# Patient Record
Sex: Female | Born: 1955 | Race: White | Hispanic: No | Marital: Married | Smoking: Never smoker
Health system: Southern US, Community
[De-identification: ages and names within clinical notes are randomized; demographics above are authoritative.]

## PROBLEM LIST (undated history)

## (undated) DIAGNOSIS — N951 Menopausal and female climacteric states: Secondary | ICD-10-CM

## (undated) DIAGNOSIS — D179 Benign lipomatous neoplasm, unspecified: Secondary | ICD-10-CM

## (undated) DIAGNOSIS — F419 Anxiety disorder, unspecified: Secondary | ICD-10-CM

## (undated) HISTORY — DX: Anxiety disorder, unspecified: F41.9

## (undated) HISTORY — PX: BUNIONECTOMY: SHX129

## (undated) HISTORY — DX: Benign lipomatous neoplasm, unspecified: D17.9

## (undated) HISTORY — DX: Menopausal and female climacteric states: N95.1

---

## 2000-06-25 ENCOUNTER — Other Ambulatory Visit: Admission: RE | Admit: 2000-06-25 | Discharge: 2000-06-25 | Payer: Self-pay | Admitting: Gynecology

## 2001-07-06 ENCOUNTER — Other Ambulatory Visit: Admission: RE | Admit: 2001-07-06 | Discharge: 2001-07-06 | Payer: Self-pay | Admitting: Gynecology

## 2002-07-17 ENCOUNTER — Other Ambulatory Visit: Admission: RE | Admit: 2002-07-17 | Discharge: 2002-07-17 | Payer: Self-pay | Admitting: Gynecology

## 2003-07-23 ENCOUNTER — Other Ambulatory Visit: Admission: RE | Admit: 2003-07-23 | Discharge: 2003-07-23 | Payer: Self-pay | Admitting: Gynecology

## 2004-08-19 ENCOUNTER — Other Ambulatory Visit: Admission: RE | Admit: 2004-08-19 | Discharge: 2004-08-19 | Payer: Self-pay | Admitting: Gynecology

## 2005-06-09 ENCOUNTER — Encounter: Admission: RE | Admit: 2005-06-09 | Discharge: 2005-06-09 | Payer: Self-pay | Admitting: Internal Medicine

## 2005-06-09 ENCOUNTER — Ambulatory Visit: Payer: Self-pay | Admitting: Internal Medicine

## 2005-06-25 ENCOUNTER — Ambulatory Visit: Payer: Self-pay | Admitting: Internal Medicine

## 2005-08-24 ENCOUNTER — Other Ambulatory Visit: Admission: RE | Admit: 2005-08-24 | Discharge: 2005-08-24 | Payer: Self-pay | Admitting: Gynecology

## 2006-09-02 ENCOUNTER — Other Ambulatory Visit: Admission: RE | Admit: 2006-09-02 | Discharge: 2006-09-02 | Payer: Self-pay | Admitting: Gynecology

## 2006-09-14 ENCOUNTER — Encounter: Payer: Self-pay | Admitting: Internal Medicine

## 2006-09-21 ENCOUNTER — Ambulatory Visit: Payer: Self-pay | Admitting: Internal Medicine

## 2007-03-20 IMAGING — CR DG FOOT COMPLETE 3+V*L*
3 series · 3 of 3 positions shown · non-contrast
Comparison: none

CLINICAL DATA: Pain left foot.
 LEFT FOOT ? 3 VIEW:

[t foot ap left]
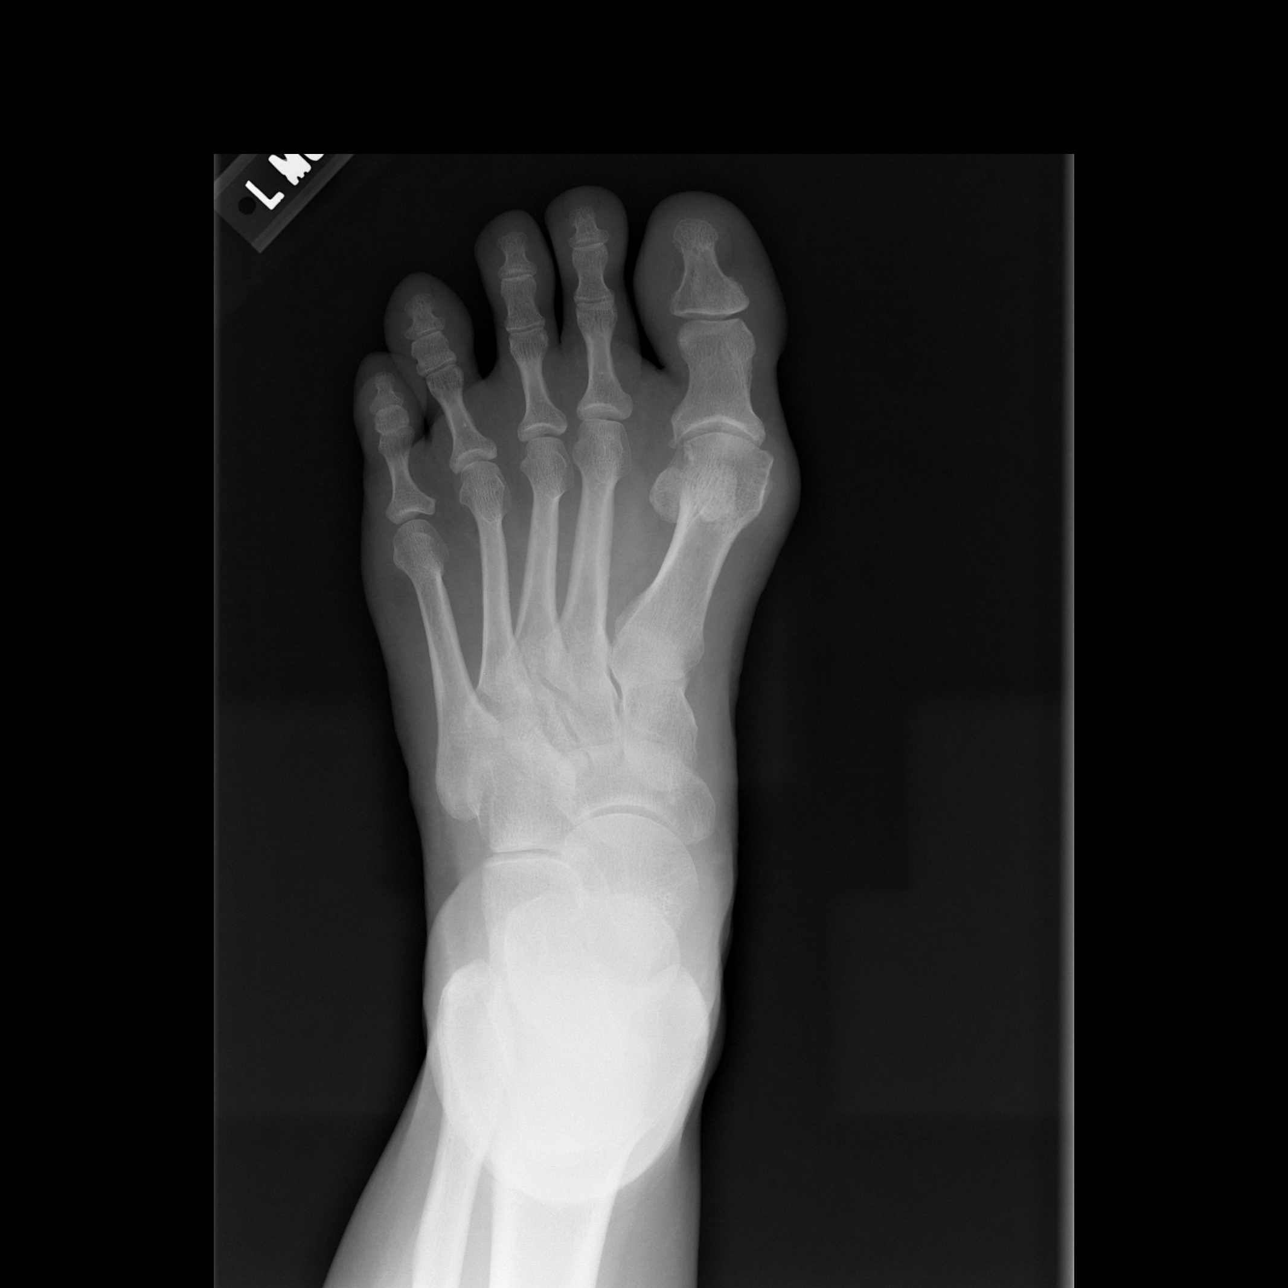

[t foot oblique left]
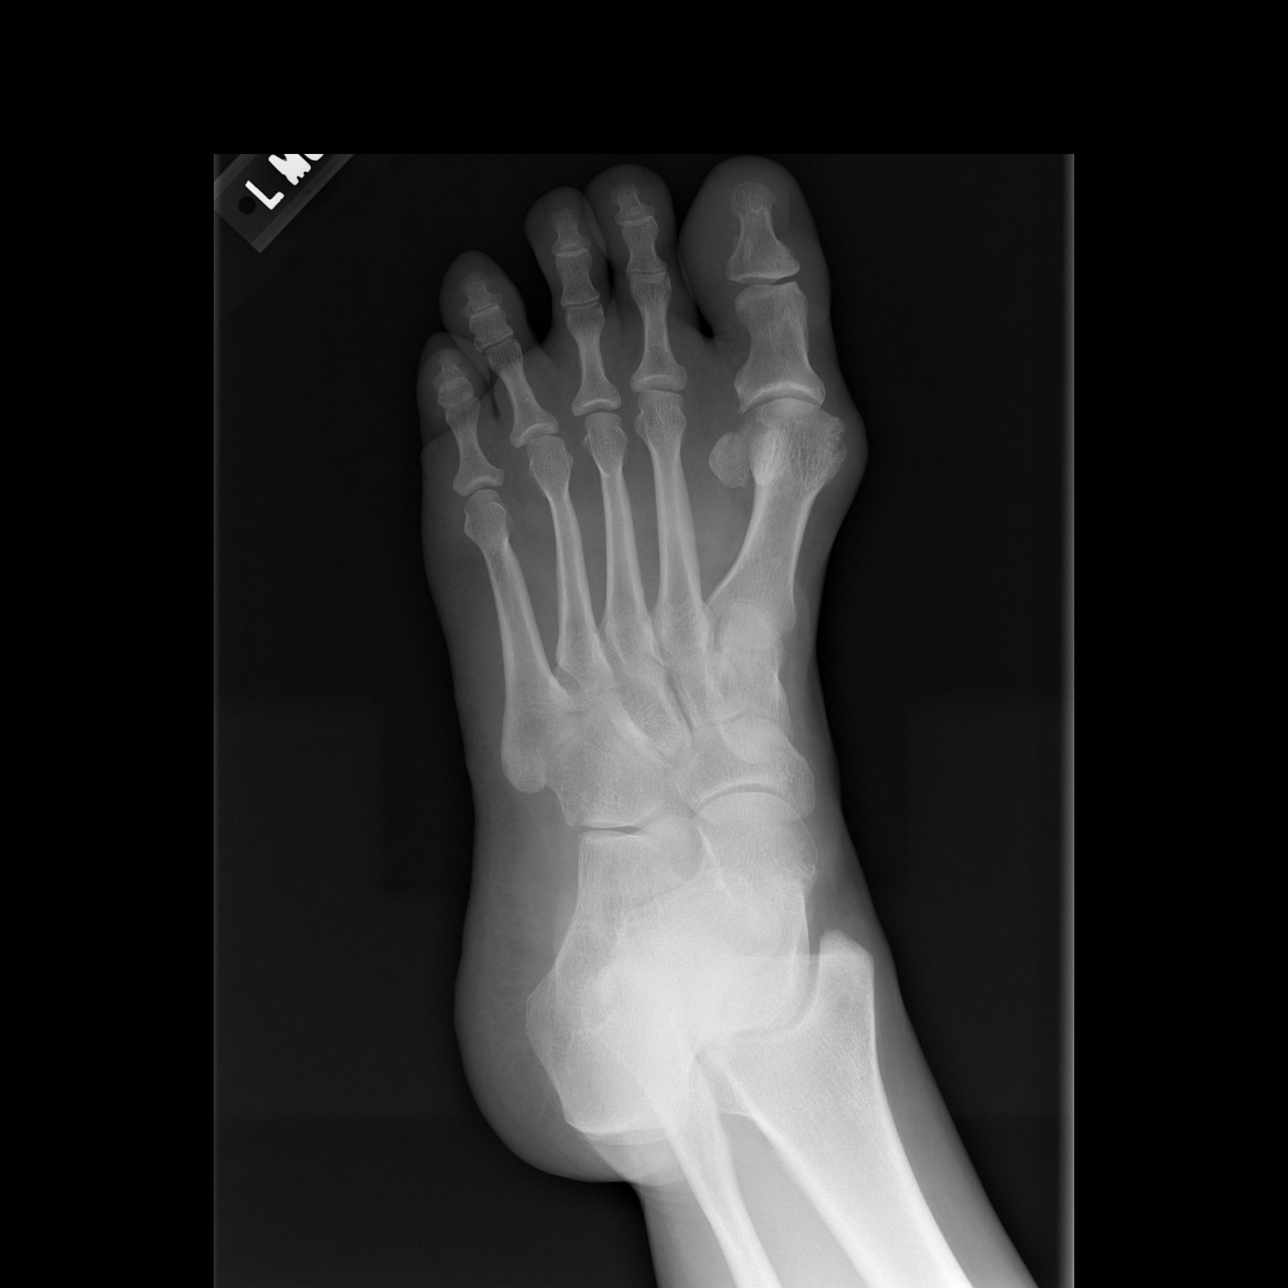

[t foot lat left]
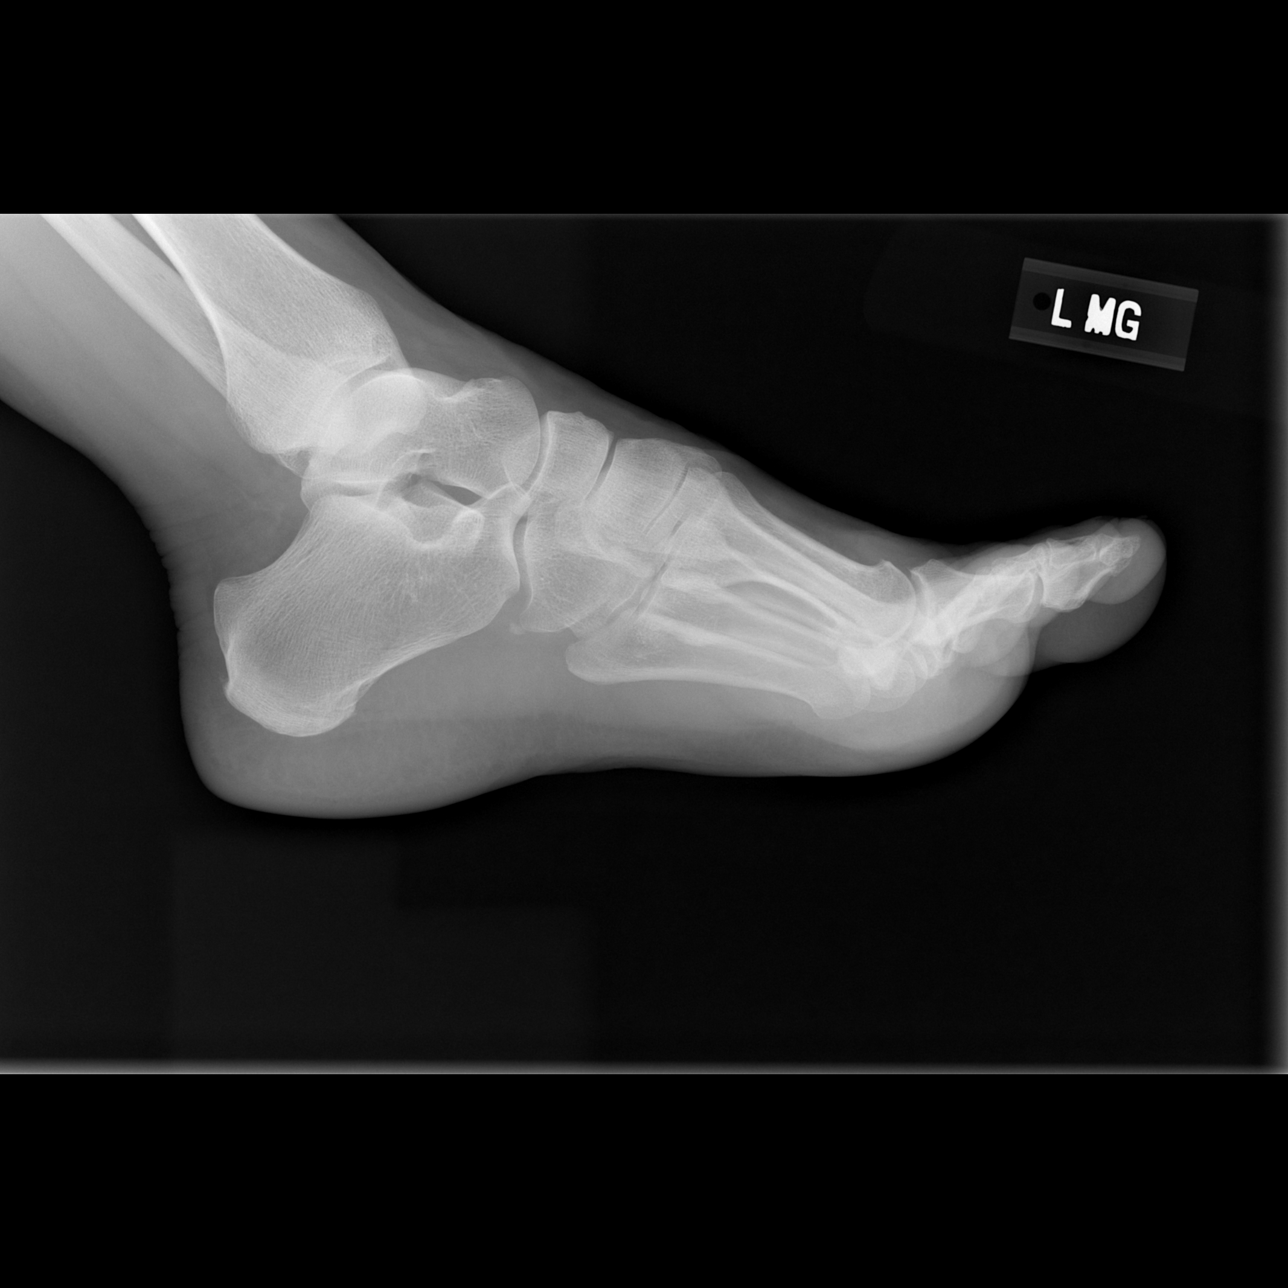

[3 of 3 positions shown; findings below may reference images not displayed]

FINDINGS: Three views of the left foot were obtained.  There is degenerative change at the left first MTP joint.  No acute fracture is seen and alignment is normal.
IMPRESSION: Degenerative change left first MTP joint.

## 2007-08-08 LAB — CONVERTED CEMR LAB: Pap Smear: NORMAL

## 2007-08-26 ENCOUNTER — Telehealth (INDEPENDENT_AMBULATORY_CARE_PROVIDER_SITE_OTHER): Payer: Self-pay | Admitting: *Deleted

## 2007-08-30 ENCOUNTER — Encounter: Payer: Self-pay | Admitting: Internal Medicine

## 2007-09-06 ENCOUNTER — Other Ambulatory Visit: Admission: RE | Admit: 2007-09-06 | Discharge: 2007-09-06 | Payer: Self-pay | Admitting: Gynecology

## 2007-09-07 ENCOUNTER — Encounter: Payer: Self-pay | Admitting: Internal Medicine

## 2007-10-06 ENCOUNTER — Ambulatory Visit: Payer: Self-pay | Admitting: Internal Medicine

## 2007-10-06 DIAGNOSIS — F411 Generalized anxiety disorder: Secondary | ICD-10-CM | POA: Insufficient documentation

## 2007-10-11 LAB — CONVERTED CEMR LAB
ALT: 14 units/L (ref 0–35)
AST: 17 units/L (ref 0–37)
Albumin: 3.9 g/dL (ref 3.5–5.2)
Alkaline Phosphatase: 54 units/L (ref 39–117)
BUN: 14 mg/dL (ref 6–23)
Basophils Absolute: 0 10*3/uL (ref 0.0–0.1)
Basophils Relative: 0 % (ref 0.0–1.0)
Bilirubin, Direct: 0.1 mg/dL (ref 0.0–0.3)
CO2: 30 meq/L (ref 19–32)
Calcium: 9.6 mg/dL (ref 8.4–10.5)
Chloride: 111 meq/L (ref 96–112)
Cholesterol: 193 mg/dL (ref 0–200)
Creatinine, Ser: 0.8 mg/dL (ref 0.4–1.2)
Eosinophils Absolute: 0.1 10*3/uL (ref 0.0–0.6)
Eosinophils Relative: 2.2 % (ref 0.0–5.0)
GFR calc Af Amer: 96 mL/min
GFR calc non Af Amer: 80 mL/min
Glucose, Bld: 102 mg/dL — ABNORMAL HIGH (ref 70–99)
HCT: 40.1 % (ref 36.0–46.0)
HDL: 73.1 mg/dL (ref >39.0)
Hemoglobin: 13.5 g/dL (ref 12.0–15.0)
LDL Cholesterol: 101 mg/dL — ABNORMAL HIGH (ref 0–99)
Lymphocytes Relative: 33.8 % (ref 12.0–46.0)
MCHC: 33.6 g/dL (ref 30.0–36.0)
MCV: 93.5 fL (ref 78.0–100.0)
Monocytes Absolute: 0.7 10*3/uL (ref 0.2–0.7)
Monocytes Relative: 11.9 % — ABNORMAL HIGH (ref 3.0–11.0)
Neutro Abs: 3 10*3/uL (ref 1.4–7.7)
Neutrophils Relative %: 52.1 % (ref 43.0–77.0)
Platelets: 354 10*3/uL (ref 150–400)
Potassium: 4.3 meq/L (ref 3.5–5.1)
RBC: 4.29 M/uL (ref 3.87–5.11)
RDW: 12.5 % (ref 11.5–14.6)
Sodium: 148 meq/L — ABNORMAL HIGH (ref 135–145)
TSH: 2.93 microintl units/mL (ref 0.35–5.50)
Total Bilirubin: 0.9 mg/dL (ref 0.3–1.2)
Total CHOL/HDL Ratio: 2.6
Total Protein: 6.7 g/dL (ref 6.0–8.3)
Triglycerides: 96 mg/dL (ref 0–149)
VLDL: 19 mg/dL (ref 0–40)
WBC: 5.7 10*3/uL (ref 4.5–10.5)

## 2008-06-27 ENCOUNTER — Ambulatory Visit: Payer: Self-pay | Admitting: Family Medicine

## 2008-11-01 ENCOUNTER — Encounter: Payer: Self-pay | Admitting: Internal Medicine

## 2008-11-29 ENCOUNTER — Encounter: Admission: RE | Admit: 2008-11-29 | Discharge: 2008-11-29 | Payer: Self-pay | Admitting: Family Medicine

## 2009-07-25 ENCOUNTER — Telehealth (INDEPENDENT_AMBULATORY_CARE_PROVIDER_SITE_OTHER): Payer: Self-pay | Admitting: *Deleted

## 2010-01-14 ENCOUNTER — Ambulatory Visit: Payer: Self-pay | Admitting: Internal Medicine

## 2010-01-14 DIAGNOSIS — M259 Joint disorder, unspecified: Secondary | ICD-10-CM | POA: Insufficient documentation

## 2010-01-16 ENCOUNTER — Encounter (INDEPENDENT_AMBULATORY_CARE_PROVIDER_SITE_OTHER): Payer: Self-pay | Admitting: *Deleted

## 2010-01-31 ENCOUNTER — Encounter: Payer: Self-pay | Admitting: Internal Medicine

## 2010-02-10 ENCOUNTER — Ambulatory Visit: Payer: Self-pay | Admitting: Internal Medicine

## 2010-02-13 LAB — CONVERTED CEMR LAB
ALT: 15 units/L (ref 0–35)
AST: 19 units/L (ref 0–37)
Albumin: 4.2 g/dL (ref 3.5–5.2)
Alkaline Phosphatase: 64 units/L (ref 39–117)
BUN: 12 mg/dL (ref 6–23)
Basophils Absolute: 0 10*3/uL (ref 0.0–0.1)
Basophils Relative: 0.4 % (ref 0.0–3.0)
Bilirubin, Direct: 0.1 mg/dL (ref 0.0–0.3)
CO2: 29 meq/L (ref 19–32)
Calcium: 9.3 mg/dL (ref 8.4–10.5)
Chloride: 107 meq/L (ref 96–112)
Cholesterol: 223 mg/dL — ABNORMAL HIGH (ref 0–200)
Creatinine, Ser: 0.7 mg/dL (ref 0.4–1.2)
Direct LDL: 138.9 mg/dL
Eosinophils Absolute: 0.1 10*3/uL (ref 0.0–0.7)
Eosinophils Relative: 0.9 % (ref 0.0–5.0)
GFR calc non Af Amer: 87.55 mL/min (ref >60)
Glucose, Bld: 85 mg/dL (ref 70–99)
HCT: 36.6 % (ref 36.0–46.0)
HDL: 73.1 mg/dL (ref >39.00)
Hemoglobin: 12.6 g/dL (ref 12.0–15.0)
Lymphocytes Relative: 40.9 % (ref 12.0–46.0)
Lymphs Abs: 2.3 10*3/uL (ref 0.7–4.0)
MCHC: 34.5 g/dL (ref 30.0–36.0)
MCV: 92.3 fL (ref 78.0–100.0)
Monocytes Absolute: 0.5 10*3/uL (ref 0.1–1.0)
Monocytes Relative: 9.3 % (ref 3.0–12.0)
Neutro Abs: 2.8 10*3/uL (ref 1.4–7.7)
Neutrophils Relative %: 48.5 % (ref 43.0–77.0)
Platelets: 317 10*3/uL (ref 150.0–400.0)
Potassium: 3.9 meq/L (ref 3.5–5.1)
RBC: 3.96 M/uL (ref 3.87–5.11)
RDW: 14.2 % (ref 11.5–14.6)
Sodium: 142 meq/L (ref 135–145)
TSH: 2.4 microintl units/mL (ref 0.35–5.50)
Total Bilirubin: 1.2 mg/dL (ref 0.3–1.2)
Total CHOL/HDL Ratio: 3
Total Protein: 6.7 g/dL (ref 6.0–8.3)
Triglycerides: 72 mg/dL (ref 0.0–149.0)
VLDL: 14.4 mg/dL (ref 0.0–40.0)
WBC: 5.7 10*3/uL (ref 4.5–10.5)

## 2010-04-17 ENCOUNTER — Encounter: Payer: Self-pay | Admitting: Internal Medicine

## 2010-08-14 ENCOUNTER — Encounter: Payer: Self-pay | Admitting: Internal Medicine

## 2010-09-09 IMAGING — US US EXTREM LOW VENOUS*R*
1 series · 14 of 24 positions shown · non-contrast
Comparison: None.

CLINICAL DATA: Right knee swelling.  Question varicosity or DVT.

RIGHT LOWER EXTREMITY VENOUS DUPLEX ULTRASOUND
TECHNIQUE: Gray-scale sonography with graded compression, as well
as color Doppler and duplex ultrasound, were performed to evaluate
the deep venous system of the right none. lower extremity from the
level of the common femoral vein through the popliteal and proximal
calf veins. Spectral Doppler was utilized to evaluate flow at rest
and with distal augmentation maneuvers.

[Series 1: us extrem low venous*right* · 14 of 48 slices shown]
[im 1/48]
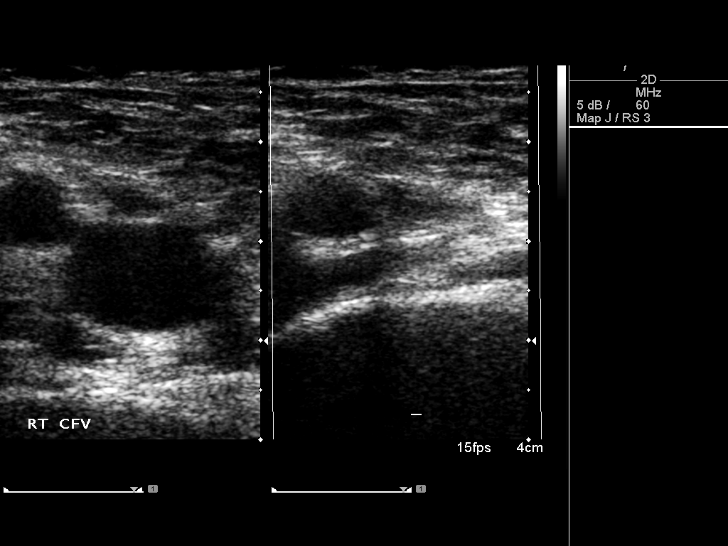
[im 5/48]
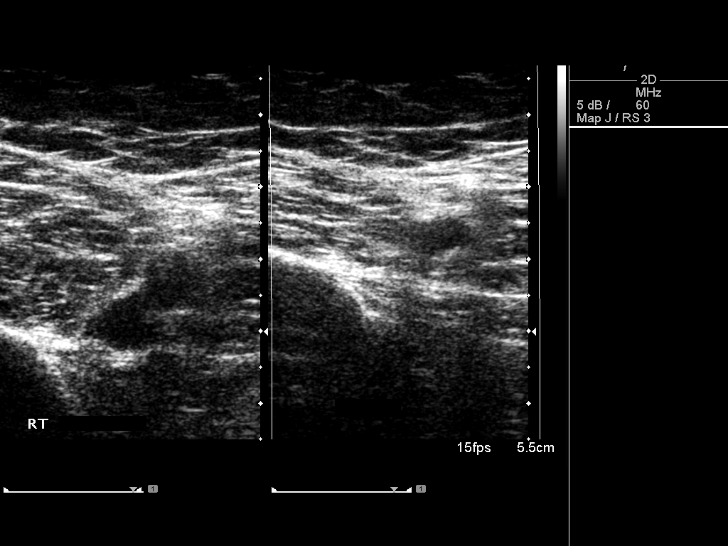
[im 9/48]
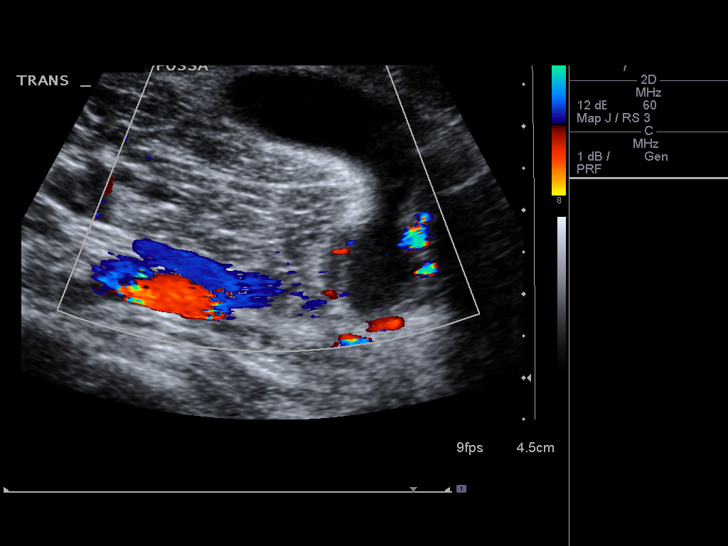
[im 13/48]
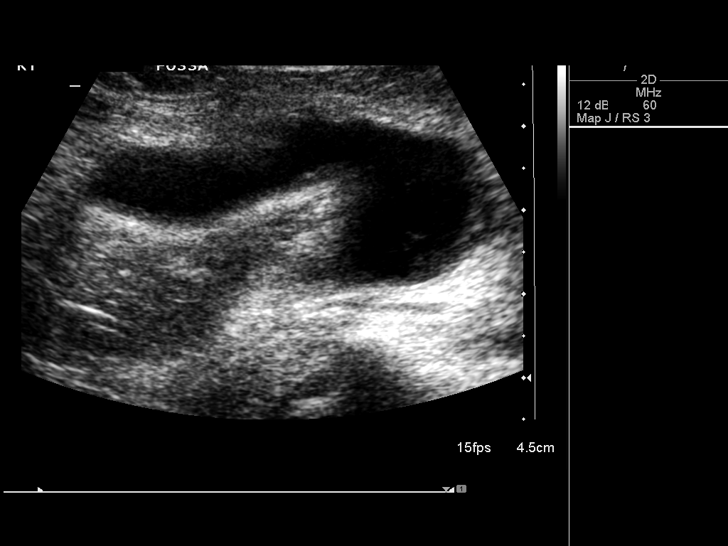
[im 15/48]
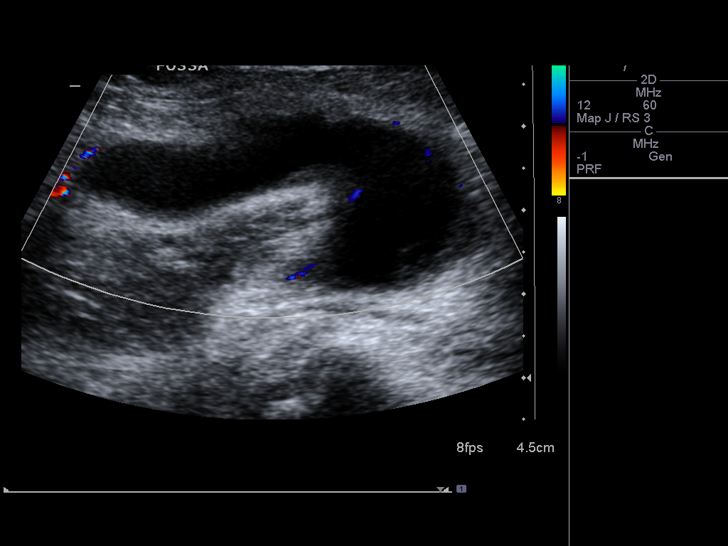
[im 19/48]
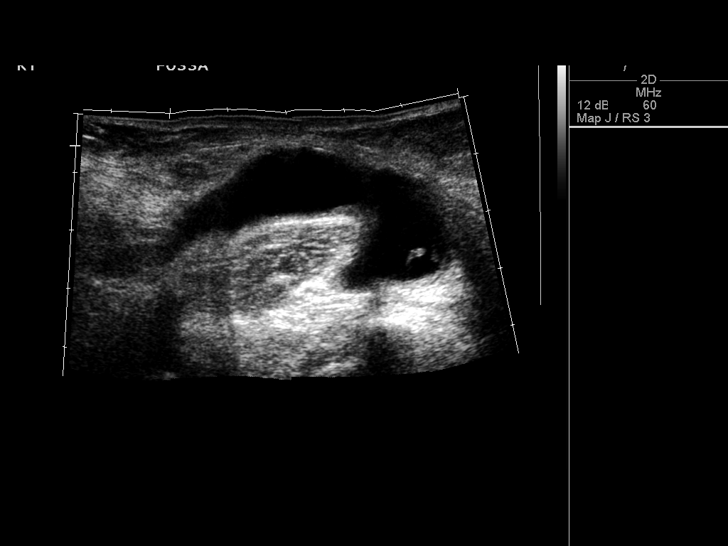
[im 23/48]
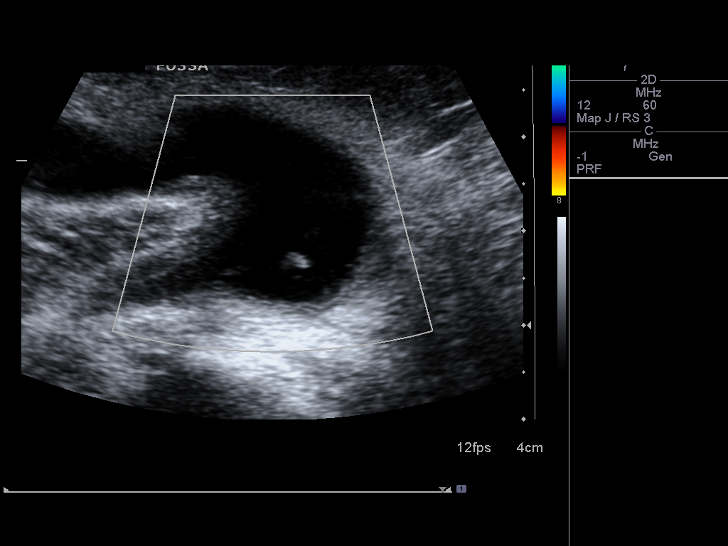
[im 25/48]
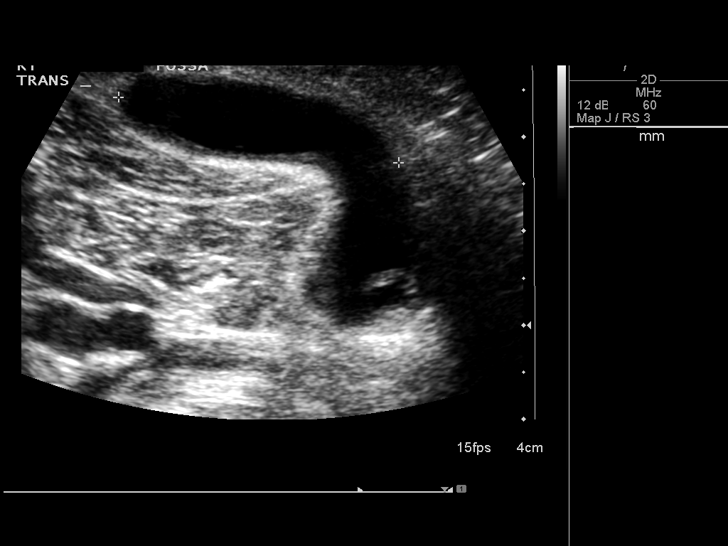
[im 29/48]
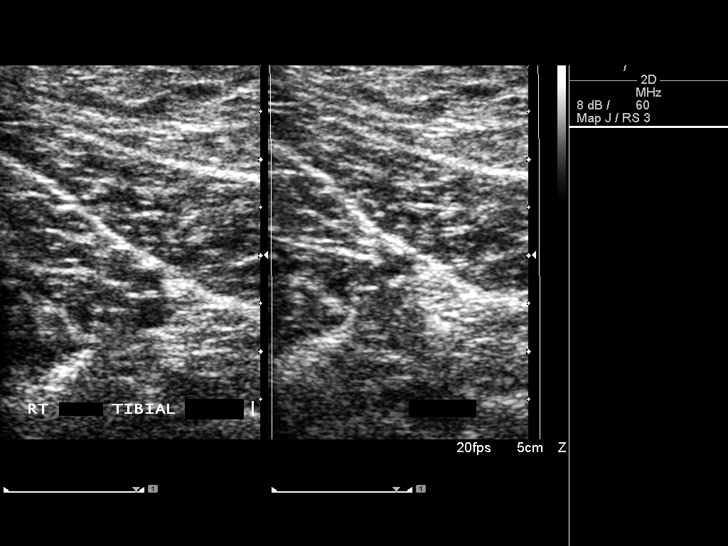
[im 33/48]
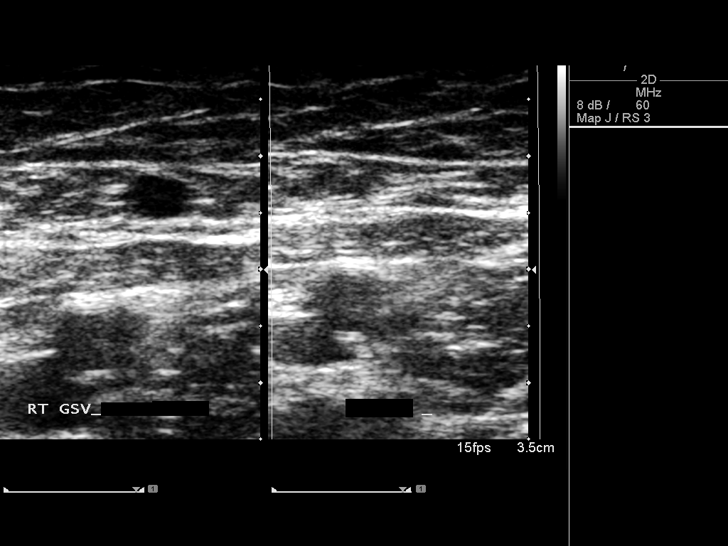
[im 37/48]
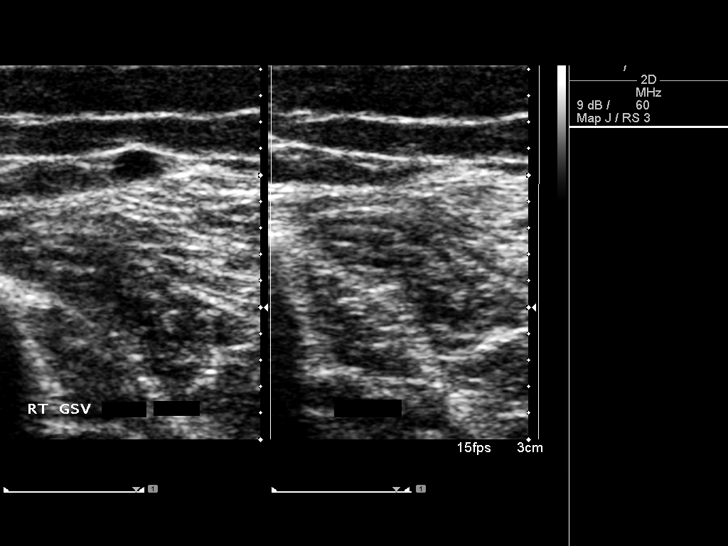
[im 39/48]
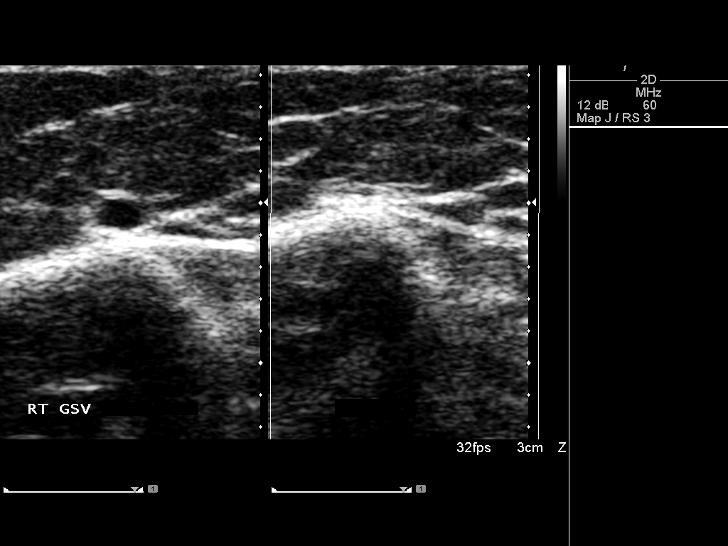
[im 43/48]
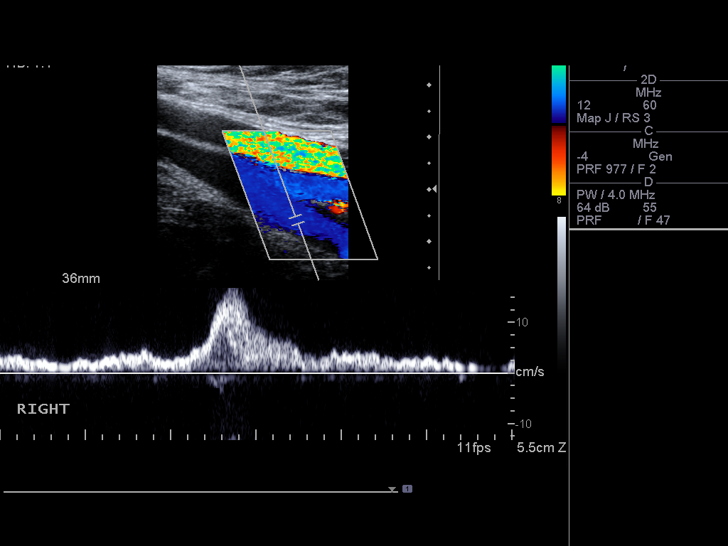
[im 48/48]
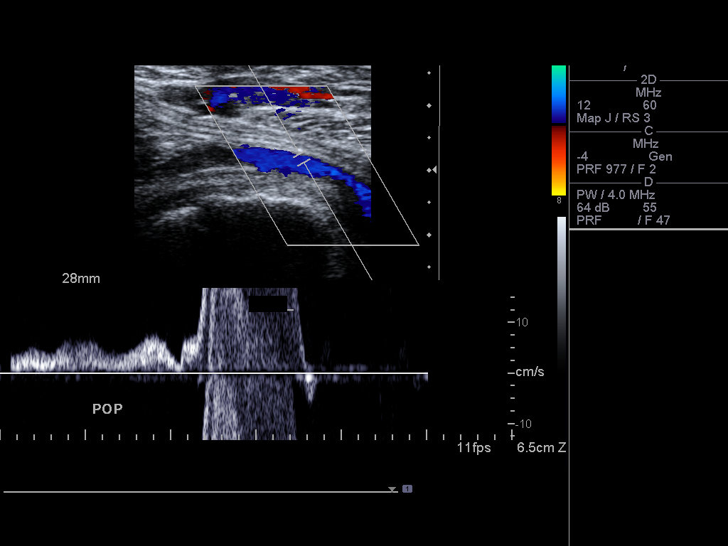

[14 of 24 positions shown; findings below may reference images not displayed]

FINDINGS: The right common femoral vein, superficial femoral vein,
deep femoral vein, popliteal vein, and saphenofemoral junction show
normal patent directional flow, normal phasicity, normal
augmentation, and normal compression. The posterior tibial veins
are compressible where visualized.

A 6 x 1 x 3 cm simple cystic structure is identified in the right
popliteal fossa, probably representing a Baker's cyst.
IMPRESSION: No evidence for DVT in the right lower extremity.

Cystic structure in the posterior soft tissues of the popliteal
region is compatible with a Baker's cyst.

## 2010-10-05 LAB — CONVERTED CEMR LAB
ALT: 13 units/L (ref 0–40)
AST: 16 units/L (ref 0–37)
Albumin: 4 g/dL (ref 3.5–5.2)
Alkaline Phosphatase: 53 units/L (ref 39–117)
BUN: 13 mg/dL (ref 6–23)
CO2: 30 meq/L (ref 19–32)
Calcium: 9.4 mg/dL (ref 8.4–10.5)
Chloride: 104 meq/L (ref 96–112)
Cholesterol: 192 mg/dL (ref 0–200)
Creatinine, Ser: 0.9 mg/dL (ref 0.4–1.2)
GFR calc Af Amer: 85 mL/min
GFR calc non Af Amer: 70 mL/min
Glucose, Bld: 98 mg/dL (ref 70–99)
HCT: 40.6 % (ref 36.0–46.0)
HDL: 65.1 mg/dL (ref >39.0)
Hemoglobin: 13.2 g/dL (ref 12.0–15.0)
LDL Cholesterol: 113 mg/dL — ABNORMAL HIGH (ref 0–99)
MCHC: 32.5 g/dL (ref 30.0–36.0)
MCV: 94 fL (ref 78.0–100.0)
Platelets: 408 10*3/uL — ABNORMAL HIGH (ref 150–400)
Potassium: 4.1 meq/L (ref 3.5–5.1)
RBC: 4.32 M/uL (ref 3.87–5.11)
RDW: 12.7 % (ref 11.5–14.6)
Sodium: 139 meq/L (ref 135–145)
Total Bilirubin: 1.2 mg/dL (ref 0.3–1.2)
Total CHOL/HDL Ratio: 2.9
Total Protein: 6.9 g/dL (ref 6.0–8.3)
Triglycerides: 71 mg/dL (ref 0–149)
VLDL: 14 mg/dL (ref 0–40)
WBC: 5.3 10*3/uL (ref 4.5–10.5)

## 2010-10-07 NOTE — Assessment & Plan Note (Signed)
Summary: CPX AND FASTING LABS??////SPH   Vital Signs:  Patient profile:   55 year old female Height:      63.25 inches Weight:      141 pounds BMI:     24.87 Temp:     98.1 degrees F oral Pulse rate:   72 / minute BP sitting:   90 / 62  (left arm)  Vitals Entered By: Jeremy Johann CMA (February 10, 2010 2:05 PM) CC: yearly Comments -fasting (last meal 7 am) REVIEWED MED LIST, PATIENT AGREED DOSE AND INSTRUCTION CORRECT    History of Present Illness: CPX status post evaluation by the hand specialist, diagnosed with Dupuytren's, he recommend observation  Allergies: 1)  ! * Mangos  Past History:  Past Medical History: + PPD - treated perimenopausal several lipomas, s/p excision of few of them Anxiety Dx w/ Dupuyten's in the hands (2011)  Past Surgical History: Reviewed history from 10/06/2007 and no changes required. bunion surgery B 2007  Family History: colon Ca - no Breast Ca -GM Hodgkin's lymphoma - M CAD - F dx in his 14s  DM - GP HTN - GP Pacemaker-- F M   Social History: Married  (husband Nurse, adult) 3 kids works FT , Statistician, Scientist, physiological-- healthy exercise-- walks, yard work , yoga, kayak Tobacco-- no ETOH-- socially   Review of Systems CV:  Denies chest pain or discomfort and swelling of feet. Resp:  Denies cough and shortness of breath. GI:  Denies bloody stools, diarrhea, and nausea. GU:  sees gyn routinely . Psych:  Denies anxiety and depression.  Physical Exam  General:  alert, well-developed, and well-nourished.   Neck:  no masses, no thyromegaly, and normal carotid upstroke.   Lungs:  normal respiratory effort, no intercostal retractions, no accessory muscle use, and normal breath sounds.   Heart:  normal rate, regular rhythm, no murmur, and no gallop.   Abdomen:  soft, non-tender, and no distention.   Extremities:   no pretibial edema bilaterally  Psych:  Cognition and judgment appear intact. Alert and cooperative with normal  attention span and concentration.  not anxious appearing and not depressed appearing.     Impression & Recommendations:  Problem # 1:  HEALTH SCREENING (ICD-V70.0)  Td 04  diet-exercise reviewed  female care per gyn Cscope 1-08, next 2018  some diff.  sleeping ----> tips provided   Orders: Venipuncture (13086) TLB-BMP (Basic Metabolic Panel-BMET) (80048-METABOL) TLB-CBC Platelet - w/Differential (85025-CBCD) TLB-Hepatic/Liver Function Pnl (80076-HEPATIC) TLB-Lipid Panel (80061-LIPID) TLB-TSH (Thyroid Stimulating Hormone) (84443-TSH)  Complete Medication List: 1)  Vitamin D 2000  .... Daily 2)  Glucosamine/chondrotin 1500/1200  .... Daily 3)  Calcium 500mg   .... Daily 4)  Fish Oil 1000mg   .... Week 5)  Garlic 1000mg   .... Daily 6)  Vit B12  .... Daily  Patient Instructions: 1)  Please schedule a follow-up appointment in 1 year.

## 2010-10-07 NOTE — Assessment & Plan Note (Signed)
Summary: LEFT HAND BONE SPUR/ALR   Vital Signs:  Patient profile:   55 year old female Height:      67.25 inches Weight:      142.4 pounds BMI:     22.22 Pulse rate:   86 / minute BP sitting:   120 / 80  Vitals Entered By: Shary Decamp (Jan 14, 2010 11:36 AM) CC: cyst in left hand x "first of the year"   History of Present Illness: noted a knot in the L hand, noted first by  09-2009 no redness or discharge it is causing discomfort whenever she grips something  Current Medications (verified): 1)  Vitamin D 2000 .... Daily 2)  Glucosamine/chondrotin 1500/1200 .... Daily 3)  Calcium 500mg  .... Daily 4)  Fish Oil 1000mg  .... Week 5)  Garlic 1000mg  .... Daily 6)  Vit B12 .... Daily  Allergies (verified): 1)  ! * Mangos  Past History:  Past Medical History: Reviewed history from 10/06/2007 and no changes required. + PPD - treated perimenopausal several lipomas, s/p excision of few of them Anxiety  Past Surgical History: Reviewed history from 10/06/2007 and no changes required. bunion surgery B 2007  Social History: Reviewed history from 10/06/2007 and no changes required. Married 3 kids  Review of Systems        no other abnormalities noted on her hands no wrist pain, no trigger fingers noted  Physical Exam  General:  alert, well-developed, and well-nourished.   Extremities:   right hand normal left hand has a superficial, hard , 3 to 4-mm in size nodule in the palmar aspect, on top of the third tendon.  The lesion does not seems to be attached to a deeper structure   Impression & Recommendations:  Problem # 1:  UNSPECIFIED DISORDER OF HAND JOINT (ICD-719.94)  superficial hand lesion, dermatofibroma? It is causing discomfort,  needs excision refer  to ortho hand  patient's cell [phone]  Orders: Orthopedic Referral (Ortho)  Problem # 2:  states will call and get a appointment for a general checkup  Complete Medication List: 1)  Vitamin  D 2000  .... Daily 2)  Glucosamine/chondrotin 1500/1200  .... Daily 3)  Calcium 500mg   .... Daily 4)  Fish Oil 1000mg   .... Week 5)  Garlic 1000mg   .... Daily 6)  Vit B12  .... Daily

## 2010-10-07 NOTE — Consult Note (Signed)
Summary: dx Dupuytren's-----[facility]   Imported By: Lanelle Bal 02/20/2010 09:56:01  _____________________________________________________________________  External Attachment:    Type:   Image     Comment:   External Document

## 2010-10-07 NOTE — Letter (Signed)
Summary: Primary Care Consult Scheduled Letter  [facility] [place]  [address] [place], [place] [address]   Phone: [phone]  Fax: [phone]      01/16/2010 MRN: [id number]  [name] [address] [place], [place]  [address]    Dear Ms. Donnie Coffin,    We have scheduled an appointment for you.  At the recommendation of Dr. Willow Ora, we have scheduled you a consult with Dr. Josephine Igo of [facility] on 01-27-2010 arrive by 9:30am.  Their address is [address] [place] [address]. The office phone number is [phone].  If this appointment day and time is not convenient for you, please feel free to call the office of the doctor you are being referred to at the number listed above and reschedule the appointment.    It is important for you to keep your scheduled appointments. We are here to make sure you are given good patient care.   Thank you,    Renee, Patient Care Coordinator [place] [facility]

## 2010-10-09 NOTE — Letter (Signed)
Summary: PAP sent, + osteopenia, Rx ca , vit d--- Nicholas Lose MD  Beather Arbour MD   Imported By: Lanelle Bal 08/28/2010 11:31:21  _____________________________________________________________________  External Attachment:    Type:   Image     Comment:   External Document

## 2011-08-21 ENCOUNTER — Encounter: Payer: Self-pay | Admitting: Internal Medicine

## 2011-08-24 ENCOUNTER — Ambulatory Visit (INDEPENDENT_AMBULATORY_CARE_PROVIDER_SITE_OTHER): Payer: BC Managed Care – PPO | Admitting: Internal Medicine

## 2011-08-24 DIAGNOSIS — Z23 Encounter for immunization: Secondary | ICD-10-CM

## 2011-08-24 DIAGNOSIS — Z0000 Encounter for general adult medical examination without abnormal findings: Secondary | ICD-10-CM | POA: Insufficient documentation

## 2011-08-24 DIAGNOSIS — F411 Generalized anxiety disorder: Secondary | ICD-10-CM

## 2011-08-24 NOTE — Assessment & Plan Note (Addendum)
Td 04 , likes a Tdap-- done diet-exercise reviewed  female care per gyn Cscope 1-08, next 2018 Getting iFOBs at gyn yearly Labs

## 2011-08-24 NOTE — Progress Notes (Signed)
  Subjective:    Patient ID: Paige Harrison, female    DOB: [birth year], 55 y.o.   MRN: [id number]  HPI CPX  Past Medical History: + PPD ----> treated (in High school) perimenopausal several lipomas, s/p excision of few of them Anxiety, no current issues  Dx w/ Dupuyten's in the hands (2011) R Baker's cyst 11-2008 per u/s  Past Surgical History: bunion surgery B 2007  Family History: colon Ca - no Breast Ca -GM dx ~ 50 Hodgkin's lymphoma - M CAD - F dx in his 8s  Pacemaker-- F M  DM - GP HTN - GP  Social History: Married  (husband Nurse, adult) 3 kids, all independent  Works PT , Statistician, Scientist, physiological-- healthy exercise-- walks, yard work , yoga, kayak Tobacco-- no ETOH-- socially   Review of Systems no CP-SOB No N V D, no blood in stools No dysuria or gross hematuria  Had a cold last week, better      Objective:   Physical Exam  Constitutional: She is oriented to person, place, and time. She appears well-developed and well-nourished. No distress.  HENT:  Head: Normocephalic and atraumatic.  Neck: No thyromegaly present.       Nl carotid pulse  Cardiovascular: Normal rate, regular rhythm and normal heart sounds.   No murmur heard. Pulmonary/Chest: Effort normal and breath sounds normal. She has no wheezes. She has no rales.  Abdominal: Soft. Bowel sounds are normal. She exhibits no distension. There is no tenderness. There is no rebound and no guarding.  Musculoskeletal: She exhibits no edema.  Neurological: She is alert and oriented to person, place, and time.  Skin: She is not diaphoretic.  Psychiatric: She has a normal mood and affect. Her behavior is normal. Judgment and thought content normal.      Assessment & Plan:

## 2011-08-24 NOTE — Assessment & Plan Note (Signed)
Resolved

## 2011-08-25 LAB — COMPREHENSIVE METABOLIC PANEL
ALT: 16 U/L (ref 0–35)
AST: 20 U/L (ref 0–37)
Albumin: 4.2 g/dL (ref 3.5–5.2)
Alkaline Phosphatase: 66 U/L (ref 39–117)
BUN: 9 mg/dL (ref 6–23)
CO2: 28 mEq/L (ref 19–32)
Calcium: 9.4 mg/dL (ref 8.4–10.5)
Chloride: 104 mEq/L (ref 96–112)
Creatinine, Ser: 0.6 mg/dL (ref 0.4–1.2)
GFR: 111.32 mL/min (ref >60.00)
Glucose, Bld: 92 mg/dL (ref 70–99)
Potassium: 4 mEq/L (ref 3.5–5.1)
Sodium: 139 mEq/L (ref 135–145)
Total Bilirubin: 0.9 mg/dL (ref 0.3–1.2)
Total Protein: 7.2 g/dL (ref 6.0–8.3)

## 2011-08-25 LAB — CBC WITH DIFFERENTIAL/PLATELET
Basophils Absolute: 0 10*3/uL (ref 0.0–0.1)
Basophils Relative: 0.3 % (ref 0.0–3.0)
Eosinophils Absolute: 0.1 10*3/uL (ref 0.0–0.7)
Eosinophils Relative: 1.8 % (ref 0.0–5.0)
HCT: 35.7 % — ABNORMAL LOW (ref 36.0–46.0)
Hemoglobin: 12.2 g/dL (ref 12.0–15.0)
Lymphocytes Relative: 29.1 % (ref 12.0–46.0)
Lymphs Abs: 2.4 10*3/uL (ref 0.7–4.0)
MCHC: 34.1 g/dL (ref 30.0–36.0)
MCV: 92.7 fl (ref 78.0–100.0)
Monocytes Absolute: 0.8 10*3/uL (ref 0.1–1.0)
Monocytes Relative: 9.6 % (ref 3.0–12.0)
Neutro Abs: 4.8 10*3/uL (ref 1.4–7.7)
Neutrophils Relative %: 59.2 % (ref 43.0–77.0)
Platelets: 328 10*3/uL (ref 150.0–400.0)
RBC: 3.86 Mil/uL — ABNORMAL LOW (ref 3.87–5.11)
RDW: 13 % (ref 11.5–14.6)
WBC: 8.1 10*3/uL (ref 4.5–10.5)

## 2011-08-25 LAB — LIPID PANEL
Cholesterol: 188 mg/dL (ref 0–200)
HDL: 64.7 mg/dL (ref >39.00)
LDL Cholesterol: 106 mg/dL — ABNORMAL HIGH (ref 0–99)
Total CHOL/HDL Ratio: 3
Triglycerides: 88 mg/dL (ref 0.0–149.0)
VLDL: 17.6 mg/dL (ref 0.0–40.0)

## 2011-08-25 LAB — TSH: TSH: 4.42 u[IU]/mL (ref 0.35–5.50)

## 2011-08-30 ENCOUNTER — Encounter: Payer: Self-pay | Admitting: Internal Medicine

## 2011-11-09 ENCOUNTER — Ambulatory Visit (INDEPENDENT_AMBULATORY_CARE_PROVIDER_SITE_OTHER): Payer: BC Managed Care – PPO | Admitting: Internal Medicine

## 2011-11-09 VITALS — BP 112/70 | HR 71 | Temp 97.8°F | Wt 148.0 lb

## 2011-11-09 DIAGNOSIS — M549 Dorsalgia, unspecified: Secondary | ICD-10-CM | POA: Insufficient documentation

## 2011-11-09 LAB — POCT URINALYSIS DIPSTICK
Bilirubin, UA: NEGATIVE
Glucose, UA: NEGATIVE
Ketones, UA: NEGATIVE
Leukocytes, UA: NEGATIVE
Nitrite, UA: NEGATIVE
Protein, UA: NEGATIVE
Spec Grav, UA: 1.01
Urobilinogen, UA: 0.2
pH, UA: 5

## 2011-11-09 MED ORDER — CYCLOBENZAPRINE HCL 10 MG PO TABS: 10.0000 mg | ORAL_TABLET | Freq: Two times a day (BID) | ORAL | Status: AC | PRN

## 2011-11-09 MED ORDER — HYDROCODONE-ACETAMINOPHEN 5-500 MG PO TABS: 1.0000 | ORAL_TABLET | Freq: Four times a day (QID) | ORAL | Status: AC | PRN

## 2011-11-09 NOTE — Assessment & Plan Note (Signed)
Most likely a muscleskeletal problem. Udip trace + for blood, will do a UA and UCX but doubt pyelonephritis Will treat as a muscle pain, see instructions

## 2011-11-09 NOTE — Progress Notes (Signed)
  Subjective:    Patient ID: Paige Harrison, female    DOB: March 10, 1956, 56 y.o.   MRN: [id number]  HPI Acute visit  2 weeks ago developed a right-sided thoracic back pain described as a grabbing pain with deep breaths. That eventually faded away. 4 days ago developed intense pain at the  left back, left from the lower thoracic spine. This particular pain is intense, on-off, increase if she stands up or if she sits for long periods; no change by bending her torso. Also pain decrease if uses a heating pad or put pressure in the area.  Past Medical History:  + PPD ----> treated (in High school)  perimenopausal  several lipomas, s/p excision of few of them  Anxiety, no current issues  Dx w/ Dupuyten's in the hands (2011)  R Baker's cyst 11-2008 per u/s   Past Surgical History:  bunion surgery B 2007   Review of Systems No fever chills No cough, no rash in the back. No dysuria or gross hematuria. No abdominal pain per se, no nausea, vomiting, diarrhea or blood in the stools. No LE paresthesias    Objective:   Physical Exam  Constitutional: She is oriented to person, place, and time. She appears well-developed and well-nourished. No distress.  HENT:  Head: Normocephalic and atraumatic.  Cardiovascular: Normal rate, regular rhythm and normal heart sounds.   No murmur heard. Pulmonary/Chest: Effort normal and breath sounds normal. No respiratory distress. She has no wheezes. She has no rales.  Abdominal: Soft. She exhibits no distension. There is no tenderness. There is no rebound and no guarding.  Musculoskeletal:       Back:       No CVA  tenderness   Neurological: She is alert and oriented to person, place, and time.  Skin: She is not diaphoretic.  Psychiatric: She has a normal mood and affect. Her behavior is normal. Judgment and thought content normal.      Assessment & Plan:

## 2011-11-09 NOTE — Patient Instructions (Signed)
Heating pad, rest Motrin OTC as needed for pain Flexeril twice a day (muscle relaxant) and vicodin (pain killer ) as needed. Both will cause drowsiness Call if no better in few days Call if pain increases, you have fever or a rash

## 2011-11-10 ENCOUNTER — Encounter: Payer: Self-pay | Admitting: Internal Medicine

## 2011-11-10 LAB — URINALYSIS, MICROSCOPIC ONLY
Bacteria, UA: NONE SEEN
Casts: NONE SEEN
Crystals: NONE SEEN
Squamous Epithelial / HPF: NONE SEEN

## 2011-11-11 LAB — URINE CULTURE: Colony Count: >=100000

## 2011-11-12 MED ORDER — CIPROFLOXACIN HCL 500 MG PO TABS: 500.0000 mg | ORAL_TABLET | Freq: Two times a day (BID) | ORAL | Status: AC

## 2011-11-12 NOTE — Progress Notes (Signed)
Addended by: Edwena Felty T on: 11/12/2011 03:21 PM   Modules accepted: Orders

## 2011-11-26 ENCOUNTER — Encounter: Payer: Self-pay | Admitting: Family Medicine

## 2011-11-26 ENCOUNTER — Ambulatory Visit (INDEPENDENT_AMBULATORY_CARE_PROVIDER_SITE_OTHER): Payer: BC Managed Care – PPO | Admitting: Family Medicine

## 2011-11-26 VITALS — BP 100/70 | HR 108 | Temp 98.2°F | Wt 146.6 lb

## 2011-11-26 DIAGNOSIS — J40 Bronchitis, not specified as acute or chronic: Secondary | ICD-10-CM

## 2011-11-26 MED ORDER — AZITHROMYCIN 250 MG PO TABS: ORAL_TABLET | ORAL | Status: AC

## 2011-11-26 MED ORDER — ALBUTEROL SULFATE HFA 108 (90 BASE) MCG/ACT IN AERS: 2.0000 | INHALATION_SPRAY | Freq: Four times a day (QID) | RESPIRATORY_TRACT | Status: AC | PRN

## 2011-11-26 MED ORDER — GUAIFENESIN-CODEINE 100-10 MG/5ML PO SYRP: ORAL_SOLUTION | ORAL | Status: DC

## 2011-11-26 NOTE — Patient Instructions (Signed)

## 2011-11-26 NOTE — Progress Notes (Signed)
  Subjective:     Paige Harrison is a 56 y.o. female here for evaluation of a cough. Onset of symptoms was 5 days ago. Symptoms have been gradually worsening since that time. The cough is productive and is aggravated by exercise and reclining position. Associated symptoms include: chills, shortness of breath, sputum production and wheezing. Patient does not have a history of asthma. Patient does not have a history of environmental allergens. Patient has not traveled recently. Patient does not have a history of smoking. Patient has not had a previous chest x-ray. Patient has not had a PPD done.  The following portions of the patient's history were reviewed and updated as appropriate: allergies, current medications, past family history, past medical history, past social history, past surgical history and problem list.  Review of Systems Pertinent items are noted in HPI.    Objective:    Oxygen saturation 97% on room air BP 100/70  Pulse 108  Temp(Src) 98.2 F (36.8 C) (Oral)  Wt 146 lb 9.6 oz (66.497 kg)  SpO2 97% General appearance: alert, cooperative, appears stated age and no distress Ears: normal TM's and external ear canals both ears Nose: Nares normal. Septum midline. Mucosa normal. No drainage or sinus tenderness. Throat: lips, mucosa, and tongue normal; teeth and gums normal Neck: no adenopathy and thyroid not enlarged, symmetric, no tenderness/mass/nodules Lungs: diminished breath sounds bilaterally Heart: S1, S2 normal    Assessment:    Acute Bronchitis    Plan:    Antibiotics per medication orders. Antitussives per medication orders. Avoid exposure to tobacco smoke and fumes. B-agonist inhaler. Call if shortness of breath worsens, blood in sputum, change in character of cough, development of fever or chills, inability to maintain nutrition and hydration. Avoid exposure to tobacco smoke and fumes. Trial of antihistamines.

## 2012-09-02 ENCOUNTER — Encounter: Payer: Self-pay | Admitting: Internal Medicine

## 2012-12-02 ENCOUNTER — Ambulatory Visit (INDEPENDENT_AMBULATORY_CARE_PROVIDER_SITE_OTHER): Payer: BC Managed Care – PPO | Admitting: Internal Medicine

## 2012-12-02 ENCOUNTER — Encounter: Payer: Self-pay | Admitting: Internal Medicine

## 2012-12-02 VITALS — BP 100/70 | HR 76 | Temp 98.1°F | Ht 63.5 in | Wt 145.0 lb

## 2012-12-02 DIAGNOSIS — Z0000 Encounter for general adult medical examination without abnormal findings: Secondary | ICD-10-CM

## 2012-12-02 LAB — CBC WITH DIFFERENTIAL/PLATELET
Basophils Absolute: 0 10*3/uL (ref 0.0–0.1)
Basophils Relative: 0.6 % (ref 0.0–3.0)
Eosinophils Absolute: 0.1 10*3/uL (ref 0.0–0.7)
Eosinophils Relative: 1.6 % (ref 0.0–5.0)
HCT: 37 % (ref 36.0–46.0)
Hemoglobin: 12.6 g/dL (ref 12.0–15.0)
Lymphocytes Relative: 30.4 % (ref 12.0–46.0)
Lymphs Abs: 1.8 10*3/uL (ref 0.7–4.0)
MCHC: 34 g/dL (ref 30.0–36.0)
MCV: 90.8 fl (ref 78.0–100.0)
Monocytes Absolute: 0.5 10*3/uL (ref 0.1–1.0)
Monocytes Relative: 9.2 % (ref 3.0–12.0)
Neutro Abs: 3.4 10*3/uL (ref 1.4–7.7)
Neutrophils Relative %: 58.2 % (ref 43.0–77.0)
Platelets: 459 10*3/uL — ABNORMAL HIGH (ref 150.0–400.0)
RBC: 4.07 Mil/uL (ref 3.87–5.11)
RDW: 13.5 % (ref 11.5–14.6)
WBC: 5.9 10*3/uL (ref 4.5–10.5)

## 2012-12-02 LAB — COMPREHENSIVE METABOLIC PANEL
ALT: 23 U/L (ref 0–35)
AST: 22 U/L (ref 0–37)
Albumin: 4.1 g/dL (ref 3.5–5.2)
Alkaline Phosphatase: 58 U/L (ref 39–117)
BUN: 13 mg/dL (ref 6–23)
CO2: 26 mEq/L (ref 19–32)
Calcium: 9 mg/dL (ref 8.4–10.5)
Chloride: 102 mEq/L (ref 96–112)
Creatinine, Ser: 0.7 mg/dL (ref 0.4–1.2)
GFR: 90.99 mL/min (ref >60.00)
Glucose, Bld: 91 mg/dL (ref 70–99)
Potassium: 3.9 mEq/L (ref 3.5–5.1)
Sodium: 135 mEq/L (ref 135–145)
Total Bilirubin: 1.1 mg/dL (ref 0.3–1.2)
Total Protein: 7.2 g/dL (ref 6.0–8.3)

## 2012-12-02 LAB — LIPID PANEL
Cholesterol: 180 mg/dL (ref 0–200)
HDL: 56.7 mg/dL (ref >39.00)
LDL Cholesterol: 110 mg/dL — ABNORMAL HIGH (ref 0–99)
Total CHOL/HDL Ratio: 3
Triglycerides: 67 mg/dL (ref 0.0–149.0)
VLDL: 13.4 mg/dL (ref 0.0–40.0)

## 2012-12-02 LAB — TSH: TSH: 2.61 u[IU]/mL (ref 0.35–5.50)

## 2012-12-02 NOTE — Progress Notes (Signed)
  Subjective:    Patient ID: Paige Harrison, female    DOB: 11/01/1955, 57 y.o.   MRN: [id number]  HPI CPX   Past Medical History:   + PPD ----> treated (in High school)   perimenopausal   several lipomas, s/p excision of few of them   Anxiety, no current issues   Dx w/ Dupuyten's in the hands (2011)   R Baker's cyst 11-2008 per u/s   Past Surgical History:   bunion surgery B 2007    Family History: colon Ca - no Breast Ca -GM dx ~ 48 Hodgkin's lymphoma and Large B cell lymphoma - M CAD - F dx in his 5s Pacemaker-- F M   DM - GP HTN - GP  Social History: Married  (husband Nurse, adult) 3 kids, all independent  , 1 girl, 2 boys Works PT , Statistician, Museum/gallery exhibitions officer-- healthy exercise-- walks, yard work , yoga Tobacco-- no ETOH-- socially    Review of Systems  Respiratory: Negative for cough and shortness of breath.   Cardiovascular: Negative for chest pain and leg swelling.  Gastrointestinal: Negative for abdominal pain and blood in stool.  Genitourinary: Negative for hematuria and difficulty urinating.  Psychiatric/Behavioral:       No anxiety-depression  occ L elbow pain, uses arms a lot at work, no swelling      Objective:   Physical Exam General -- alert, well-developed, BMI 25  .   Neck --no thyromegaly Lungs -- normal respiratory effort, no intercostal retractions, no accessory muscle use, and normal breath sounds.   Heart-- normal rate, regular rhythm, no murmur, and no gallop.   Abdomen--soft, non-tender, no distention, no masses, no HSM, no guarding, and no rigidity.   Extremities-- no pretibial edema bilaterally  Neurologic-- alert & oriented X3 and strength normal in all extremities. Psych-- Cognition and judgment appear intact. Alert and cooperative with normal attention span and concentration.  not anxious appearing and not depressed appearing.       Assessment & Plan:

## 2012-12-02 NOTE — Assessment & Plan Note (Addendum)
Td  2012 diet-exercise : doing great  MMG (-) 2013 female care per gyn; gyn also give iFOB Reports DEXA @ Gyn-- osteopenia on ca and vit D; will call if likes me to reorder dexa Cscope 1-08, next 2018 Labs  Also c/o elbow pain, will see ortho for tennis elbow if no better

## 2012-12-03 LAB — VITAMIN D 25 HYDROXY (VIT D DEFICIENCY, FRACTURES): Vit D, 25-Hydroxy: 43 ng/mL (ref 30–89)

## 2012-12-05 ENCOUNTER — Encounter: Payer: Self-pay | Admitting: *Deleted

## 2013-01-24 LAB — HM PAP SMEAR: HM Pap smear: NEGATIVE

## 2013-08-14 LAB — HM MAMMOGRAPHY: HM Mammogram: NEGATIVE

## 2013-10-09 ENCOUNTER — Encounter: Payer: Self-pay | Admitting: Internal Medicine

## 2013-12-14 ENCOUNTER — Telehealth: Payer: Self-pay

## 2013-12-14 NOTE — Telephone Encounter (Signed)
Medication and allergies:  Reviewed and updated  90 day supply/mail order: na Local pharmacy: [facility] [address]   Immunizations due:  shingles  A/P:   No changes to FH, PSH or Personal Hx Flu vaccine--not this season Tdap--08/2011 Shingles--due Pap--gyn--scheduled in May MMG--08/2013--neg bi rads Bone Density--2011 last one [facility]--2008--neg per patient--next due 2018  To Discuss with Provider: Order Dexa

## 2013-12-15 ENCOUNTER — Encounter: Payer: Self-pay | Admitting: Internal Medicine

## 2013-12-15 ENCOUNTER — Ambulatory Visit (INDEPENDENT_AMBULATORY_CARE_PROVIDER_SITE_OTHER): Payer: BC Managed Care – PPO | Admitting: Internal Medicine

## 2013-12-15 VITALS — BP 115/72 | HR 72 | Temp 98.0°F | Ht 63.5 in | Wt 145.0 lb

## 2013-12-15 DIAGNOSIS — Z0000 Encounter for general adult medical examination without abnormal findings: Secondary | ICD-10-CM

## 2013-12-15 DIAGNOSIS — N959 Unspecified menopausal and perimenopausal disorder: Secondary | ICD-10-CM

## 2013-12-15 LAB — TSH: TSH: 1.36 u[IU]/mL (ref 0.35–5.50)

## 2013-12-15 LAB — CBC WITH DIFFERENTIAL/PLATELET
Basophils Absolute: 0 10*3/uL (ref 0.0–0.1)
Basophils Relative: 0.4 % (ref 0.0–3.0)
Eosinophils Absolute: 0.1 10*3/uL (ref 0.0–0.7)
Eosinophils Relative: 2 % (ref 0.0–5.0)
HCT: 35.2 % — ABNORMAL LOW (ref 36.0–46.0)
Hemoglobin: 11.9 g/dL — ABNORMAL LOW (ref 12.0–15.0)
Lymphocytes Relative: 32.8 % (ref 12.0–46.0)
Lymphs Abs: 1.7 10*3/uL (ref 0.7–4.0)
MCHC: 33.7 g/dL (ref 30.0–36.0)
MCV: 91.2 fl (ref 78.0–100.0)
Monocytes Absolute: 0.5 10*3/uL (ref 0.1–1.0)
Monocytes Relative: 9.3 % (ref 3.0–12.0)
Neutro Abs: 2.8 10*3/uL (ref 1.4–7.7)
Neutrophils Relative %: 55.5 % (ref 43.0–77.0)
Platelets: 361 10*3/uL (ref 150.0–400.0)
RBC: 3.86 Mil/uL — ABNORMAL LOW (ref 3.87–5.11)
RDW: 13.4 % (ref 11.5–14.6)
WBC: 5.1 10*3/uL (ref 4.5–10.5)

## 2013-12-15 LAB — COMPREHENSIVE METABOLIC PANEL
ALT: 19 U/L (ref 0–35)
AST: 23 U/L (ref 0–37)
Albumin: 3.8 g/dL (ref 3.5–5.2)
Alkaline Phosphatase: 58 U/L (ref 39–117)
BUN: 8 mg/dL (ref 6–23)
CO2: 28 mEq/L (ref 19–32)
Calcium: 8.8 mg/dL (ref 8.4–10.5)
Chloride: 106 mEq/L (ref 96–112)
Creatinine, Ser: 0.6 mg/dL (ref 0.4–1.2)
GFR: 106.28 mL/min (ref >60.00)
Glucose, Bld: 93 mg/dL (ref 70–99)
Potassium: 3.7 mEq/L (ref 3.5–5.1)
Sodium: 138 mEq/L (ref 135–145)
Total Bilirubin: 0.9 mg/dL (ref 0.3–1.2)
Total Protein: 6.5 g/dL (ref 6.0–8.3)

## 2013-12-15 LAB — LIPID PANEL
CHOLESTEROL: 164 mg/dL (ref 0–200)
HDL: 64.2 mg/dL (ref >39.00)
LDL Cholesterol: 88 mg/dL (ref 0–99)
Total CHOL/HDL Ratio: 3
Triglycerides: 57 mg/dL (ref 0.0–149.0)
VLDL: 11.4 mg/dL (ref 0.0–40.0)

## 2013-12-15 NOTE — Progress Notes (Signed)
Pre visit review using our clinic review tool, if applicable. No additional management support is needed unless otherwise documented below in the visit note. 

## 2013-12-15 NOTE — Assessment & Plan Note (Addendum)
Td  2012 zostavax discussed , Will call and set up prior to get the shot. diet-exercise : doing great  Pap--gyn--scheduled in May  MMG--08/2013--neg bi rads  Bone Density--2011 last one ---------ordering one, takes ca and vit D EKG nsr  Cscope 1-08, next 2018 Labs

## 2013-12-15 NOTE — Patient Instructions (Signed)
Get your blood work before you leave   Next visit is for a physical exam in 1 year  fasting Please make an appointment    Please schedule an appointment to get your Zostavax (shingles shot)

## 2013-12-15 NOTE — Progress Notes (Signed)
   Subjective:    Patient ID: Paige Harrison, female    DOB: [birth year], 58 y.o.   MRN: [id number]  DOS:  12/15/2013 Type of  Visit:  CPX   ROS Diet-- very healthy Exercise-- very active at work, yoga No  CP, SOB No palpitations, no lower extremity edema Denies  nausea, vomiting diarrhea Denies  blood in the stools (-) cough, sputum production (-) wheezing, chest congestion No dysuria, gross hematuria, difficulty urinating  No anxiety, depression    Past Medical History:   + PPD ----> treated (in High school)   perimenopausal   several lipomas, s/p excision of few of them   Anxiety, no current issues   Dx w/ Dupuyten's in the hands (2011)   R Baker's cyst 11-2008 per u/s   Past Surgical History:   bunion surgery B 2007    Family History: colon Ca - no Breast Ca -GM dx ~ 48 Hodgkin's lymphoma and Large B cell lymphoma - M CAD - F dx in his 58s Pacemaker-- F M   DM - GP HTN - GP  Social History: Married  (husband Arts administrator) 3 kids, all independent  , 1 girl, 2 boys, 1 g-child Works PT , Aeronautical engineer, Writer and [employer]   diet-- healthy exercise-- walks, yard work , yoga Tobacco-- no ETOH-- socially        Medication List       This list is accurate as of: 12/15/13  5:51 PM.  Always use your most recent med list.               CALCIUM + D PO  Take 1 tablet by mouth daily.     Glucosamine-Chondroitin 1500-1200 MG/30ML Liqd  Take by mouth daily.     vitamin B-12 1000 MCG tablet  Commonly known as:  CYANOCOBALAMIN  Take 1,000 mcg by mouth daily.     VITAMIN B-6 PO  Take 1 tablet by mouth daily.     Vitamin D 2000 UNITS Caps  Take by mouth daily.           Objective:   Physical Exam BP 115/72  Pulse 72  Temp(Src) 98 F (36.7 C)  Ht 5' 3.5" (1.613 m)  Wt 145 lb (65.772 kg)  BMI 25.28 kg/m2  SpO2 100%  General -- alert, well-developed, NAD.  Neck --no thyromegaly , normal carotid pulse  HEENT-- Not pale. Lungs -- normal respiratory  effort, no intercostal retractions, no accessory muscle use, and normal breath sounds.  Heart-- normal rate, regular rhythm, no murmur.  Abdomen-- Not distended, good bowel sounds,soft, non-tender.  Extremities-- no pretibial edema bilaterally  Neurologic--  alert & oriented X3. Speech normal, gait normal, strength normal in all extremities.  Psych-- Cognition and judgment appear intact. Cooperative with normal attention span and concentration. No anxious or depressed appearing.      Assessment & Plan:

## 2013-12-20 ENCOUNTER — Other Ambulatory Visit (INDEPENDENT_AMBULATORY_CARE_PROVIDER_SITE_OTHER): Payer: BC Managed Care – PPO

## 2013-12-20 DIAGNOSIS — D649 Anemia, unspecified: Secondary | ICD-10-CM

## 2013-12-20 LAB — FERRITIN: FERRITIN: 75.6 ng/mL (ref 10.0–291.0)

## 2013-12-20 LAB — IRON: Iron: 39 ug/dL — ABNORMAL LOW (ref 42–145)

## 2013-12-25 ENCOUNTER — Ambulatory Visit (INDEPENDENT_AMBULATORY_CARE_PROVIDER_SITE_OTHER): Payer: BC Managed Care – PPO | Admitting: *Deleted

## 2013-12-25 ENCOUNTER — Ambulatory Visit (INDEPENDENT_AMBULATORY_CARE_PROVIDER_SITE_OTHER)
Admission: RE | Admit: 2013-12-25 | Discharge: 2013-12-25 | Disposition: A | Discharge status: Home / Self Care | Payer: BC Managed Care – PPO | Source: Ambulatory Visit | Attending: Internal Medicine | Admitting: Internal Medicine

## 2013-12-25 DIAGNOSIS — N959 Unspecified menopausal and perimenopausal disorder: Secondary | ICD-10-CM

## 2013-12-25 DIAGNOSIS — Z2911 Encounter for prophylactic immunotherapy for respiratory syncytial virus (RSV): Secondary | ICD-10-CM

## 2013-12-25 DIAGNOSIS — Z23 Encounter for immunization: Secondary | ICD-10-CM

## 2014-01-30 ENCOUNTER — Encounter: Payer: Self-pay | Admitting: Internal Medicine

## 2014-06-30 ENCOUNTER — Telehealth: Payer: Self-pay | Admitting: Internal Medicine

## 2014-06-30 DIAGNOSIS — D649 Anemia, unspecified: Secondary | ICD-10-CM

## 2014-06-30 NOTE — Telephone Encounter (Signed)
Due for labs, please arrange: Hemoglobin, iron , ferritin-- dx anemia

## 2014-07-02 NOTE — Telephone Encounter (Signed)
Letter printed and mailed to Pt informing her that she is due for lab appointment to check HgB, iron, and ferritin. Labs ordered.

## 2014-12-19 ENCOUNTER — Encounter: Payer: BC Managed Care – PPO | Admitting: Internal Medicine
